# Patient Record
Sex: Female | Born: 1995 | Race: White | Hispanic: No | Marital: Single
Health system: Southern US, Community
[De-identification: ages and names within clinical notes are randomized; demographics above are authoritative.]

---
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
## 2018-12-25 ENCOUNTER — Other Ambulatory Visit (HOSPITAL_COMMUNITY)
Admission: RE | Admit: 2018-12-25 | Discharge: 2018-12-25 | Disposition: A | Discharge status: Home / Self Care | Payer: BC Managed Care – PPO | Source: Ambulatory Visit | Attending: Physician Assistant | Admitting: Physician Assistant

## 2018-12-25 DIAGNOSIS — Z124 Encounter for screening for malignant neoplasm of cervix: Secondary | ICD-10-CM | POA: Insufficient documentation

## 2019-06-26 ENCOUNTER — Ambulatory Visit: Payer: BC Managed Care – PPO | Attending: Internal Medicine

## 2019-06-26 DIAGNOSIS — Z23 Encounter for immunization: Secondary | ICD-10-CM

## 2019-06-26 NOTE — Progress Notes (Signed)
   [facility] Vaccination [facility]  Name:  Kayla Oconnor    MRN: [id number] DOB: 1995-12-19  06/26/2019  [name] was observed post Covid-19 immunization for 15 minutes without incident. She was provided with Vaccine Information Sheet and instruction to access the V-Safe system.   [name] was instructed to call 911 with any severe reactions post vaccine: Marland Kitchen Difficulty breathing  . Swelling of face and throat  . A fast heartbeat  . A bad rash all over body  . Dizziness and weakness   Immunizations Administered    Name Date Dose VIS Date Route   Pfizer COVID-19 Vaccine 06/26/2019 10:07 AM 0.3 mL 03/07/2019 Intramuscular   Manufacturer: [employer], [employer]   Lot: [id number]   NDC: [address]-3

## 2019-07-21 ENCOUNTER — Ambulatory Visit: Payer: BC Managed Care – PPO | Attending: Internal Medicine

## 2019-07-21 DIAGNOSIS — Z23 Encounter for immunization: Secondary | ICD-10-CM

## 2019-07-21 NOTE — Progress Notes (Signed)
   Covid-[facility] Vaccination Clinic  Name:  Kayla Oconnor    MRN: [id number] DOB: 10/17/1995  07/21/2019  [name] was observed post Covid-19 immunization for 15 minutes without incident. She was provided with Vaccine Information Sheet and instruction to access the V-Safe system.   [name] was instructed to call 911 with any severe reactions post vaccine: Marland Kitchen Difficulty breathing  . Swelling of face and throat  . A fast heartbeat  . A bad rash all over body  . Dizziness and weakness   Immunizations Administered    Name Date Dose VIS Date Route   Pfizer COVID-19 Vaccine 07/21/2019  9:10 AM 0.3 mL 05/21/2018 Intramuscular   Manufacturer: [employer], [employer]   Lot: [id number]   NDC: [address]-5
